# Patient Record
Sex: Male | Born: 1935 | Race: White | Hispanic: No | Marital: Married | Smoking: Never smoker
Health system: Southern US, Community
[De-identification: ages and names within clinical notes are randomized; demographics above are authoritative.]

## PROBLEM LIST (undated history)

## (undated) DIAGNOSIS — C760 Malignant neoplasm of head, face and neck: Secondary | ICD-10-CM

## (undated) DIAGNOSIS — R682 Dry mouth, unspecified: Secondary | ICD-10-CM

## (undated) DIAGNOSIS — R350 Frequency of micturition: Secondary | ICD-10-CM

## (undated) HISTORY — PX: ESOPHAGEAL DILATION: SHX303

## (undated) HISTORY — PX: KNEE SURGERY: SHX244

---

## 2003-06-26 DIAGNOSIS — C760 Malignant neoplasm of head, face and neck: Secondary | ICD-10-CM

## 2003-06-26 HISTORY — DX: Malignant neoplasm of head, face and neck: C76.0

## 2005-05-21 ENCOUNTER — Ambulatory Visit: Payer: Self-pay | Admitting: Oncology

## 2005-06-01 ENCOUNTER — Ambulatory Visit (HOSPITAL_COMMUNITY): Admission: RE | Admit: 2005-06-01 | Discharge: 2005-06-01 | Payer: Self-pay | Admitting: Oncology

## 2005-07-13 ENCOUNTER — Ambulatory Visit (HOSPITAL_COMMUNITY): Admission: RE | Admit: 2005-07-13 | Discharge: 2005-07-13 | Payer: Self-pay | Admitting: Oncology

## 2005-07-26 ENCOUNTER — Ambulatory Visit: Payer: Self-pay | Admitting: Oncology

## 2005-09-13 ENCOUNTER — Ambulatory Visit: Payer: Self-pay | Admitting: Pulmonary Disease

## 2005-09-13 LAB — CONVERTED CEMR LAB
Albumin: 3.9 g/dL
Alkaline Phosphatase: 54 units/L
BUN: 11 mg/dL
Calcium: 9.4 mg/dL
Creatinine, Ser: 0.8 mg/dL
Glucose, Bld: 110 mg/dL
MCV: 98.4 fL
RDW: 12.3 %
Total Bilirubin: 0.9 mg/dL
Total Protein: 6.1 g/dL
Triglyceride fasting, serum: 68 mg/dL

## 2005-10-24 ENCOUNTER — Ambulatory Visit: Payer: Self-pay | Admitting: Oncology

## 2005-10-26 LAB — COMPREHENSIVE METABOLIC PANEL
ALT: 13 U/L (ref 0–40)
Albumin: 4.5 g/dL (ref 3.5–5.2)
CO2: 32 mEq/L (ref 19–32)
Calcium: 9.5 mg/dL (ref 8.4–10.5)
Chloride: 100 mEq/L (ref 96–112)
Creatinine, Ser: 0.8 mg/dL (ref 0.4–1.5)
Potassium: 4.1 mEq/L (ref 3.5–5.3)

## 2005-10-26 LAB — CBC WITH DIFFERENTIAL/PLATELET
BASO%: 0.3 % (ref 0.0–2.0)
HCT: 38.1 % — ABNORMAL LOW (ref 38.7–49.9)
LYMPH%: 16.6 % (ref 14.0–48.0)
MCH: 33 pg (ref 28.0–33.4)
MCHC: 33.7 g/dL (ref 32.0–35.9)
MCV: 98.1 fL — ABNORMAL HIGH (ref 81.6–98.0)
MONO#: 0.8 10*3/uL (ref 0.1–0.9)
MONO%: 8.9 % (ref 0.0–13.0)
NEUT%: 72.5 % (ref 40.0–75.0)
Platelets: 292 10*3/uL (ref 145–400)
WBC: 9.1 10*3/uL (ref 4.0–10.0)

## 2006-03-19 ENCOUNTER — Ambulatory Visit: Payer: Self-pay | Admitting: Oncology

## 2006-03-21 ENCOUNTER — Ambulatory Visit (HOSPITAL_COMMUNITY): Admission: RE | Admit: 2006-03-21 | Discharge: 2006-03-21 | Payer: Self-pay | Admitting: Oncology

## 2006-03-21 LAB — COMPREHENSIVE METABOLIC PANEL
ALT: 15 U/L (ref 0–40)
AST: 17 U/L (ref 0–37)
Albumin: 4.4 g/dL (ref 3.5–5.2)
Alkaline Phosphatase: 48 U/L (ref 39–117)
Potassium: 4.4 mEq/L (ref 3.5–5.3)
Sodium: 139 mEq/L (ref 135–145)
Total Bilirubin: 0.6 mg/dL (ref 0.3–1.2)
Total Protein: 6.8 g/dL (ref 6.0–8.3)

## 2006-06-17 ENCOUNTER — Ambulatory Visit: Payer: Self-pay | Admitting: Oncology

## 2006-06-21 LAB — CBC WITH DIFFERENTIAL/PLATELET
Basophils Absolute: 0 10*3/uL (ref 0.0–0.1)
Eosinophils Absolute: 0.2 10*3/uL (ref 0.0–0.5)
HGB: 13.1 g/dL (ref 13.0–17.1)
LYMPH%: 22.8 % (ref 14.0–48.0)
MCV: 99.4 fL — ABNORMAL HIGH (ref 81.6–98.0)
MONO#: 0.7 10*3/uL (ref 0.1–0.9)
MONO%: 10.6 % (ref 0.0–13.0)
NEUT#: 4.2 10*3/uL (ref 1.5–6.5)
Platelets: 320 10*3/uL (ref 145–400)
RBC: 3.92 10*6/uL — ABNORMAL LOW (ref 4.20–5.71)
RDW: 13.2 % (ref 11.2–14.6)
WBC: 6.6 10*3/uL (ref 4.0–10.0)

## 2006-06-21 LAB — COMPREHENSIVE METABOLIC PANEL
Albumin: 4.6 g/dL (ref 3.5–5.2)
BUN: 18 mg/dL (ref 6–23)
CO2: 29 mEq/L (ref 19–32)
Glucose, Bld: 92 mg/dL (ref 70–99)
Potassium: 4.5 mEq/L (ref 3.5–5.3)
Sodium: 140 mEq/L (ref 135–145)
Total Protein: 7 g/dL (ref 6.0–8.3)

## 2006-06-21 LAB — LACTATE DEHYDROGENASE: LDH: 129 U/L (ref 94–250)

## 2006-09-17 ENCOUNTER — Ambulatory Visit: Payer: Self-pay | Admitting: Oncology

## 2006-09-20 LAB — CBC WITH DIFFERENTIAL/PLATELET
BASO%: 0.6 % (ref 0.0–2.0)
EOS%: 4.4 % (ref 0.0–7.0)
HCT: 36.8 % — ABNORMAL LOW (ref 38.7–49.9)
MCH: 33.2 pg (ref 28.0–33.4)
MCHC: 33.9 g/dL (ref 32.0–35.9)
MONO#: 0.6 10*3/uL (ref 0.1–0.9)
NEUT%: 58.8 % (ref 40.0–75.0)
RBC: 3.76 10*6/uL — ABNORMAL LOW (ref 4.20–5.71)
RDW: 13.3 % (ref 11.2–14.6)
WBC: 5 10*3/uL (ref 4.0–10.0)
lymph#: 1.2 10*3/uL (ref 0.9–3.3)

## 2006-09-20 LAB — COMPREHENSIVE METABOLIC PANEL
ALT: 13 U/L (ref 0–53)
AST: 16 U/L (ref 0–37)
Albumin: 4.3 g/dL (ref 3.5–5.2)
Alkaline Phosphatase: 45 U/L (ref 39–117)
BUN: 20 mg/dL (ref 6–23)
CO2: 30 mEq/L (ref 19–32)
Calcium: 9.2 mg/dL (ref 8.4–10.5)
Chloride: 104 mEq/L (ref 96–112)
Creatinine, Ser: 0.88 mg/dL (ref 0.40–1.50)
Glucose, Bld: 109 mg/dL — ABNORMAL HIGH (ref 70–99)
Potassium: 4.3 mEq/L (ref 3.5–5.3)
Sodium: 140 mEq/L (ref 135–145)
Total Bilirubin: 0.5 mg/dL (ref 0.3–1.2)
Total Protein: 6.5 g/dL (ref 6.0–8.3)

## 2006-09-20 LAB — LACTATE DEHYDROGENASE: LDH: 127 U/L (ref 94–250)

## 2007-01-15 ENCOUNTER — Ambulatory Visit: Payer: Self-pay | Admitting: Oncology

## 2007-01-17 ENCOUNTER — Ambulatory Visit (HOSPITAL_COMMUNITY): Admission: RE | Admit: 2007-01-17 | Discharge: 2007-01-17 | Payer: Self-pay | Admitting: Oncology

## 2007-01-17 LAB — CBC WITH DIFFERENTIAL/PLATELET
BASO%: 0.4 % (ref 0.0–2.0)
Basophils Absolute: 0 10*3/uL (ref 0.0–0.1)
EOS%: 6.6 % (ref 0.0–7.0)
MCH: 33.7 pg — ABNORMAL HIGH (ref 28.0–33.4)
MCHC: 34.2 g/dL (ref 32.0–35.9)
MCV: 98.4 fL — ABNORMAL HIGH (ref 81.6–98.0)
MONO%: 10.2 % (ref 0.0–13.0)
RDW: 13 % (ref 11.2–14.6)
lymph#: 1.5 10*3/uL (ref 0.9–3.3)

## 2007-01-17 LAB — COMPREHENSIVE METABOLIC PANEL
ALT: 10 U/L (ref 0–53)
AST: 16 U/L (ref 0–37)
Alkaline Phosphatase: 41 U/L (ref 39–117)
BUN: 20 mg/dL (ref 6–23)
Calcium: 9.5 mg/dL (ref 8.4–10.5)
Chloride: 105 mEq/L (ref 96–112)
Creatinine, Ser: 0.88 mg/dL (ref 0.40–1.50)
Potassium: 4.4 mEq/L (ref 3.5–5.3)

## 2007-05-12 ENCOUNTER — Ambulatory Visit: Payer: Self-pay | Admitting: Pulmonary Disease

## 2007-05-14 ENCOUNTER — Telehealth: Payer: Self-pay | Admitting: Pulmonary Disease

## 2007-10-15 ENCOUNTER — Ambulatory Visit: Payer: Self-pay | Admitting: Oncology

## 2007-10-17 ENCOUNTER — Ambulatory Visit (HOSPITAL_COMMUNITY): Admission: RE | Admit: 2007-10-17 | Discharge: 2007-10-17 | Payer: Self-pay | Admitting: Oncology

## 2007-10-17 ENCOUNTER — Encounter: Payer: Self-pay | Admitting: Pulmonary Disease

## 2007-10-17 LAB — LACTATE DEHYDROGENASE: LDH: 138 U/L (ref 94–250)

## 2007-10-17 LAB — CBC WITH DIFFERENTIAL/PLATELET
Basophils Absolute: 0.1 10*3/uL (ref 0.0–0.1)
EOS%: 5.4 % (ref 0.0–7.0)
Eosinophils Absolute: 0.3 10*3/uL (ref 0.0–0.5)
HCT: 38.3 % — ABNORMAL LOW (ref 38.7–49.9)
HGB: 13.3 g/dL (ref 13.0–17.1)
LYMPH%: 24.2 % (ref 14.0–48.0)
MCH: 34.6 pg — ABNORMAL HIGH (ref 28.0–33.4)
MCV: 99.6 fL — ABNORMAL HIGH (ref 81.6–98.0)
MONO%: 12.2 % (ref 0.0–13.0)
NEUT#: 3.2 10*3/uL (ref 1.5–6.5)
NEUT%: 57 % (ref 40.0–75.0)
Platelets: 282 10*3/uL (ref 145–400)

## 2007-10-17 LAB — COMPREHENSIVE METABOLIC PANEL
AST: 16 U/L (ref 0–37)
Albumin: 4.6 g/dL (ref 3.5–5.2)
Alkaline Phosphatase: 42 U/L (ref 39–117)
BUN: 20 mg/dL (ref 6–23)
Creatinine, Ser: 0.81 mg/dL (ref 0.40–1.50)
Glucose, Bld: 101 mg/dL — ABNORMAL HIGH (ref 70–99)

## 2008-10-12 ENCOUNTER — Ambulatory Visit: Payer: Self-pay | Admitting: Oncology

## 2008-10-14 ENCOUNTER — Encounter: Payer: Self-pay | Admitting: Pulmonary Disease

## 2008-10-14 ENCOUNTER — Ambulatory Visit (HOSPITAL_COMMUNITY): Admission: RE | Admit: 2008-10-14 | Discharge: 2008-10-14 | Payer: Self-pay | Admitting: Oncology

## 2008-10-14 LAB — CBC WITH DIFFERENTIAL/PLATELET
BASO%: 0.5 % (ref 0.0–2.0)
EOS%: 3.2 % (ref 0.0–7.0)
HCT: 37.9 % — ABNORMAL LOW (ref 38.4–49.9)
MCH: 33.7 pg — ABNORMAL HIGH (ref 27.2–33.4)
MCHC: 33.4 g/dL (ref 32.0–36.0)
MCV: 100.9 fL — ABNORMAL HIGH (ref 79.3–98.0)
MONO%: 10.6 % (ref 0.0–14.0)
NEUT%: 59.5 % (ref 39.0–75.0)
RDW: 13.6 % (ref 11.0–14.6)
lymph#: 1.4 10*3/uL (ref 0.9–3.3)

## 2008-10-14 LAB — COMPREHENSIVE METABOLIC PANEL
ALT: 10 U/L (ref 0–53)
AST: 15 U/L (ref 0–37)
Alkaline Phosphatase: 42 U/L (ref 39–117)
Calcium: 9.4 mg/dL (ref 8.4–10.5)
Chloride: 104 mEq/L (ref 96–112)
Creatinine, Ser: 0.96 mg/dL (ref 0.40–1.50)

## 2009-10-11 ENCOUNTER — Ambulatory Visit: Payer: Self-pay | Admitting: Oncology

## 2009-10-13 ENCOUNTER — Encounter: Payer: Self-pay | Admitting: Pulmonary Disease

## 2009-10-13 LAB — COMPREHENSIVE METABOLIC PANEL
Albumin: 4.4 g/dL (ref 3.5–5.2)
CO2: 29 mEq/L (ref 19–32)
Glucose, Bld: 123 mg/dL — ABNORMAL HIGH (ref 70–99)
Potassium: 4.5 mEq/L (ref 3.5–5.3)
Sodium: 141 mEq/L (ref 135–145)
Total Bilirubin: 0.4 mg/dL (ref 0.3–1.2)
Total Protein: 6.4 g/dL (ref 6.0–8.3)

## 2009-10-13 LAB — LACTATE DEHYDROGENASE: LDH: 157 U/L (ref 94–250)

## 2009-10-13 LAB — CBC WITH DIFFERENTIAL/PLATELET
Eosinophils Absolute: 0.1 10*3/uL (ref 0.0–0.5)
HCT: 38.1 % — ABNORMAL LOW (ref 38.4–49.9)
LYMPH%: 21.6 % (ref 14.0–49.0)
MONO#: 0.6 10*3/uL (ref 0.1–0.9)
NEUT#: 3.8 10*3/uL (ref 1.5–6.5)
Platelets: 293 10*3/uL (ref 140–400)
RBC: 3.74 10*6/uL — ABNORMAL LOW (ref 4.20–5.82)
WBC: 5.7 10*3/uL (ref 4.0–10.3)

## 2009-10-19 ENCOUNTER — Ambulatory Visit (HOSPITAL_COMMUNITY): Admission: RE | Admit: 2009-10-19 | Discharge: 2009-10-19 | Payer: Self-pay | Admitting: Oncology

## 2010-04-19 ENCOUNTER — Telehealth: Payer: Self-pay | Admitting: Pulmonary Disease

## 2010-05-03 ENCOUNTER — Ambulatory Visit: Payer: Self-pay | Admitting: Internal Medicine

## 2010-05-03 DIAGNOSIS — S93336A Other dislocation of unspecified foot, initial encounter: Secondary | ICD-10-CM

## 2010-05-03 DIAGNOSIS — M79609 Pain in unspecified limb: Secondary | ICD-10-CM

## 2010-05-03 DIAGNOSIS — Z96659 Presence of unspecified artificial knee joint: Secondary | ICD-10-CM

## 2010-05-03 DIAGNOSIS — Z8589 Personal history of malignant neoplasm of other organs and systems: Secondary | ICD-10-CM | POA: Insufficient documentation

## 2010-05-15 ENCOUNTER — Encounter: Payer: Self-pay | Admitting: Internal Medicine

## 2010-06-20 ENCOUNTER — Encounter: Payer: Self-pay | Admitting: Pulmonary Disease

## 2010-07-16 ENCOUNTER — Encounter (HOSPITAL_COMMUNITY): Payer: Self-pay | Admitting: Oncology

## 2010-07-25 NOTE — Assessment & Plan Note (Signed)
Summary: NEW / MEDICARE/[place]   Vital Signs:  Patient profile:   77 year old male Height:      68.5 inches (173.99 cm) Weight:      148.4 pounds (67.45 kg) BMI:     22.32 O2 Sat:      97 % on Room air Temp:     98.1 degrees F (36.72 degrees C) oral Pulse rate:   83 / minute BP sitting:   142 / 82  (left arm) Cuff size:   regular  Vitals Entered By: Orlan Leavens RMA (May 03, 2010 3:00 PM)  O2 Flow:  Room air CC: New patient Is Patient Diabetic? No Pain Assessment Patient in pain? no        Primary Care Provider:  Newt Lukes MD  CC:  New patient.  History of Present Illness: new pt to me and our division, here to est care - remotely followed by nadel  c/o "toe lump" at base of 2nd right toe onset 03/23/10 - unchanged in size since 1st noted denies any precipitating trauma - no assoc pain, no inc swelling, no redness feels "uncomfortable" in shoe but nonpainful no hx same- no prior gout or arthritis no current joint pain or problems since onset of this toe issue does not affect walking ability - active walking new dog daily  -  Date:  09/13/2005    WBC: 9.0    HGB: 12.4    HCT: 37.9    RBC: 3.85    PLT: 315    MCV: 98.4    RDW: 12.3    BG Random: 110    BUN: 11    Creatinine: 0.8    Sodium: 143    Potassium: 4.3    Chloride: 105    CO2 Total: 30    SGOT (AST): 20    SGPT (ALT): 15    T. Bilirubin: 0.9    Alk Phos: 54    Calcium: 9.4    Total Protein: 6.1    Albumin: 3.9    Cholesterol: 187    LDL: 122    HDL: 61.7    Triglycerides: 68    TSH: 1.20    PSA 3.32  Current Medications (verified): 1)  Multivitamins  Tabs (Multiple Vitamin) .... Take 1 By Mouth Once Daily 2)  Salagen 5 Mg Tabs (Pilocarpine Hcl) .... Take 1 By Mouth Once Daily  Allergies (verified): 1)  ! 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    Physical Exam  General:  alert, well-developed, well-nourished, and cooperative to examination.    Lungs:  normal respiratory effort, no intercostal retractions or use of accessory muscles; normal breath sounds bilaterally - no crackles and no wheezes.    Heart:  normal rate, regular rhythm, no murmur, and no rub. BLE without edema. Msk:  right 2nd toe deformity with lumpy prominence over dorsal side at base of toe - 2nd toe notably shorter than other toes and pointed plantar when other toes have normal alignment Additional Exam:  procedure: closed reduction PIP 2nd toe, right foot indication: deformity,  dislocation and pain/discomfort after informed consent, closed reduction of PIP joint was performed without incident or complication. improved alignment noted. patient tolerated procedure well. 2nd toe buddy taped to 3rd toe with gauze between toes to maintain reduction.   Impression & Recommendations:  Problem # 1:  PAIN IN SOFT TISSUES OF LIMB (ICD-729.5) clinically appears c/w dislocation - xray today confirms same (read by me) unfortunately, occurred > 4weeks ago -   s/p closed reduction in office today keep buddy taped for at least one month-  same demonstrated to pt to do same at home if PIP continues to sublux or dislocate, refer to ortho for tx of same to prevent chronic dislocation complications such as pressure sores Orders: T-Toe(s) (73660TC) EMR Misc Charge Code (EMRMisc)  Problem # 2:  CLOSED DISLOCATION OF OTHER PART OF FOOT (ICD-838.09) see discussion above Orders: Orthopedic Surgeon Referral (Ortho Surgeon) EMR Misc Charge Code [facility])  Complete  Medication List: 1)  Multivitamins Tabs (Multiple vitamin) .... Take 1 by mouth once daily 2)  Salagen 5 Mg Tabs (Pilocarpine hcl) .... Take 1 by mouth once daily  Patient Instructions: 1)  it was good to see you today. 2)  your toe lump is due to a dislocation - we have reduced it but will also have foot specialist dr. Victorino Dike look at it for more help to keep it in place (referral made today) - Our office will contact you regarding this appointment once made.  3)  keep "buddy taped" until that time as demonstrated today , do this daily for next 30 days or as directed by dr. Victorino Dike 4)  Please schedule a follow-up appointment as needed.   Orders Added: 1)  New Patient Level III [16109] 2)  T-Toe(s) [73660TC] 3)  Orthopedic Surgeon Referral [Ortho Surgeon] 4)  EMR Misc Charge Code [EMRMisc]

## 2010-07-25 NOTE — Progress Notes (Signed)
Summary: SWOLLEN JOINT  Phone Note Call from Patient   Caller: Patient Call For: NADEL Summary of Call: PT HAVE SWOLLEN JOINT IN HIS TOE. NOT PAINFUL Initial call taken by: Rickard Patience,  April 19, 2010 11:00 AM  Follow-up for Phone Call        called spoke with patient's wife.  she states that pt's third toe on the right foot is swollen, with some redness x3weeks - denies pain.  wife is aware that an appt may be needed.  NOTE: pt has not been since in the office since 2008.  the paper chart has been ordered. Follow-up by: Boone Master CNA/MA,  April 19, 2010 12:28 PM  Additional Follow-up for Phone Call Additional follow up Details #1::        called to get an appt with primary at [facility]---scheduled an appt at 8am in the am but pt is unable to keep this appt---he stated that he is going to call them to see if he can reschedule to the afternoon-- Randell Loop [facility]  April 19, 2010 4:54 PM

## 2010-07-25 NOTE — Letter (Signed)
[facility]   Im[facility] By: Sherian Rein 11/07/2009 10:43:54  _____________________________________________________________________  External Attachment:    Type:   Image     Comment:   External Document

## 2010-07-25 NOTE — Consult Note (Signed)
[facility]   Imported By: Sherian Rein 05/15/2010 10:09:54  _____________________________________________________________________  External Attachment:    Type:   Image     Comment:   External Document

## 2010-07-27 NOTE — Letter (Signed)
Summary: [facility]   Imported By: Sherian Rein 07/07/2010 14:06:17  _____________________________________________________________________  External Attachment:    Type:   Image     Comment:   External Document

## 2010-10-26 ENCOUNTER — Other Ambulatory Visit (HOSPITAL_COMMUNITY): Payer: Self-pay | Admitting: Oncology

## 2010-10-26 ENCOUNTER — Encounter (HOSPITAL_BASED_OUTPATIENT_CLINIC_OR_DEPARTMENT_OTHER): Payer: Medicare Other | Admitting: Oncology

## 2010-10-26 DIAGNOSIS — Z09 Encounter for follow-up examination after completed treatment for conditions other than malignant neoplasm: Secondary | ICD-10-CM

## 2010-10-26 DIAGNOSIS — Z8589 Personal history of malignant neoplasm of other organs and systems: Secondary | ICD-10-CM

## 2010-10-26 LAB — CBC WITH DIFFERENTIAL/PLATELET
BASO%: 0.3 % (ref 0.0–2.0)
MCHC: 33 g/dL (ref 32.0–36.0)
MONO#: 0.9 10*3/uL (ref 0.1–0.9)
RBC: 3.69 10*6/uL — ABNORMAL LOW (ref 4.20–5.82)
RDW: 13.7 % (ref 11.0–14.6)
WBC: 8.4 10*3/uL (ref 4.0–10.3)
lymph#: 1.8 10*3/uL (ref 0.9–3.3)

## 2010-10-26 LAB — COMPREHENSIVE METABOLIC PANEL
ALT: 13 U/L (ref 0–53)
CO2: 27 mEq/L (ref 19–32)
Calcium: 8.9 mg/dL (ref 8.4–10.5)
Chloride: 102 mEq/L (ref 96–112)
Sodium: 139 mEq/L (ref 135–145)
Total Protein: 6.6 g/dL (ref 6.0–8.3)

## 2010-10-26 LAB — LACTATE DEHYDROGENASE: LDH: 144 U/L (ref 94–250)

## 2011-07-28 ENCOUNTER — Telehealth: Payer: Self-pay | Admitting: Oncology

## 2011-07-28 NOTE — Telephone Encounter (Signed)
lmonvm adviisng the pt of his may 2013 appt

## 2011-08-24 ENCOUNTER — Other Ambulatory Visit: Payer: Self-pay | Admitting: *Deleted

## 2011-08-24 MED ORDER — CYCLOBENZAPRINE HCL 10 MG PO TABS: 10.0000 mg | ORAL_TABLET | Freq: Four times a day (QID) | ORAL | Status: AC | PRN

## 2011-08-29 ENCOUNTER — Other Ambulatory Visit: Payer: Self-pay | Admitting: *Deleted

## 2011-10-19 ENCOUNTER — Telehealth: Payer: Self-pay | Admitting: Oncology

## 2011-10-19 NOTE — Telephone Encounter (Signed)
pt called to cx 5/2 appt and will c/b to r/s  aom

## 2011-10-25 ENCOUNTER — Ambulatory Visit: Payer: Medicare Other | Admitting: Oncology

## 2011-10-25 ENCOUNTER — Ambulatory Visit: Payer: Medicare Other | Admitting: Physician Assistant

## 2011-10-25 ENCOUNTER — Other Ambulatory Visit: Payer: Medicare Other | Admitting: Lab

## 2012-05-22 ENCOUNTER — Other Ambulatory Visit: Payer: Self-pay | Admitting: Oncology

## 2012-05-23 NOTE — Telephone Encounter (Signed)
Patient has not been seen since 10/2010, needs appt

## 2012-05-29 ENCOUNTER — Telehealth: Payer: Self-pay | Admitting: *Deleted

## 2012-05-29 ENCOUNTER — Other Ambulatory Visit: Payer: Self-pay | Admitting: Oncology

## 2012-05-29 NOTE — Telephone Encounter (Signed)
Refill request received via surescript for ketoconazole 2% shampoo.  Patient needs an appointment is message sent for this refill request..

## 2016-10-31 ENCOUNTER — Encounter (HOSPITAL_BASED_OUTPATIENT_CLINIC_OR_DEPARTMENT_OTHER): Payer: Self-pay

## 2016-10-31 ENCOUNTER — Emergency Department (HOSPITAL_BASED_OUTPATIENT_CLINIC_OR_DEPARTMENT_OTHER)
Admission: EM | Admit: 2016-10-31 | Discharge: 2016-10-31 | Disposition: A | Discharge status: Home / Self Care | Payer: Medicare Other | Attending: Emergency Medicine | Admitting: Emergency Medicine

## 2016-10-31 DIAGNOSIS — M96830 Postprocedural hemorrhage and hematoma of a musculoskeletal structure following a musculoskeletal system procedure: Secondary | ICD-10-CM | POA: Diagnosis present

## 2016-10-31 DIAGNOSIS — S61412A Laceration without foreign body of left hand, initial encounter: Secondary | ICD-10-CM | POA: Diagnosis not present

## 2016-10-31 DIAGNOSIS — Z79899 Other long term (current) drug therapy: Secondary | ICD-10-CM | POA: Insufficient documentation

## 2016-10-31 DIAGNOSIS — Y999 Unspecified external cause status: Secondary | ICD-10-CM | POA: Diagnosis not present

## 2016-10-31 DIAGNOSIS — Y939 Activity, unspecified: Secondary | ICD-10-CM | POA: Diagnosis not present

## 2016-10-31 DIAGNOSIS — R58 Hemorrhage, not elsewhere classified: Secondary | ICD-10-CM

## 2016-10-31 DIAGNOSIS — Y929 Unspecified place or not applicable: Secondary | ICD-10-CM | POA: Insufficient documentation

## 2016-10-31 DIAGNOSIS — X58XXXA Exposure to other specified factors, initial encounter: Secondary | ICD-10-CM | POA: Diagnosis not present

## 2016-10-31 HISTORY — DX: Dry mouth, unspecified: R68.2

## 2016-10-31 HISTORY — DX: Frequency of micturition: R35.0

## 2016-10-31 MED ORDER — LIDOCAINE-EPINEPHRINE 1 %-1:100000 IJ SOLN
INTRAMUSCULAR | Status: AC
  Administered 2016-10-31: 1 mL
  Filled 2016-10-31: qty 1

## 2016-10-31 NOTE — ED Triage Notes (Signed)
Pt states he had skin biopsy removed at dermatology yesterday-removed dsg this am-bleeding started-dsg applied at health clinic at  [facility] and kerlix wrap in place with no bleed through-NAD-steady gait

## 2016-10-31 NOTE — ED Provider Notes (Signed)
[facility]PT [facility] Provider Note   CSN: [id number] Arrival date & time: 10/31/16  1340     History   Chief Complaint Chief Complaint  Patient presents with  . Hand Problem    HPI Kevin Bender is a 83 y.o. male.  83 yo M with a chief complaint of bleeding from biopsy site. Patient had a skin biopsy done at his dermatologist office yesterday. He change the bandage today and noticed some very brisk bleeding. He went to his family doctor's office who placed a dressing over it and suggested he come to the ED. Denies lightheadedness. Initially describes some spurting of blood. Denied significant tenderness.   The history is provided by the patient.  Illness  This is a new problem. The current episode started yesterday. The problem occurs constantly. The problem has not changed since onset.Pertinent negatives include no chest pain, no abdominal pain, no headaches and no shortness of breath. Nothing aggravates the symptoms. Nothing relieves the symptoms. He has tried nothing for the symptoms. The treatment provided no relief.    Past Medical History:  Diagnosis Date  . Dry mouth   . Urinary frequency     Patient Active Problem List   Diagnosis Date Noted  . CARCINOMA, SQUAMOUS CELL 05/03/2010  . PAIN IN SOFT TISSUES OF LIMB 05/03/2010  . CLOSED DISLOCATION OF OTHER PART OF FOOT 05/03/2010  . KNEE REPLACEMENT, LEFT, HX OF 05/03/2010    Past Surgical History:  Procedure Laterality Date  . ESOPHAGEAL DILATION    . KNEE SURGERY         Home Medications    Prior to Admission medications   Medication Sig Start Date End Date Taking? Authorizing Provider  Fesoterodine Fumarate (TOVIAZ PO) Take by mouth.   Yes [provider]  HYDRALAZINE-HCTZ PO Take by mouth.   Yes [provider]  TRAMADOL HCL ER PO Take by mouth.   Yes [provider]    Family History No family history on file.  Social History Social History  Substance Use Topics    . Smoking status: Never Smoker  . Smokeless tobacco: Never Used  . Alcohol use No     Allergies   Ibuprofen   Review of Systems Review of Systems  Constitutional: Negative for chills and fever.  HENT: Negative for congestion and facial swelling.   Eyes: Negative for discharge and visual disturbance.  Respiratory: Negative for shortness of breath.   Cardiovascular: Negative for chest pain and palpitations.  Gastrointestinal: Negative for abdominal pain, diarrhea and vomiting.  Musculoskeletal: Negative for arthralgias and myalgias.  Skin: Positive for wound. Negative for color change and rash.  Neurological: Negative for tremors, syncope and headaches.  Psychiatric/Behavioral: Negative for confusion and dysphoric mood.     Physical Exam Updated Vital Signs BP (!) 156/100 (BP Location: Right Arm)   Pulse 84   Temp 98.3 F (36.8 C) (Oral)   Resp 16   Ht 5\' 7"  (1.702 m)   Wt 140 lb (63.5 kg)   SpO2 100%   BMI 21.93 kg/m   Physical Exam  Constitutional: He is oriented to person, place, and time. He appears well-developed and well-nourished.  HENT:  Head: Normocephalic and atraumatic.  Eyes: EOM are normal. Pupils are equal, round, and reactive to light.  Neck: Normal range of motion. Neck supple. No JVD present.  Cardiovascular: Normal rate and regular rhythm.  Exam reveals no gallop and no friction rub.   No murmur heard. Pulmonary/Chest: No respiratory distress. He  has no wheezes.  Abdominal: He exhibits no distension. There is no rebound and no guarding.  Musculoskeletal: Normal range of motion.  1cm avulsion from biopsy to left dorsum of hand.    Neurological: He is alert and oriented to person, place, and time.  Skin: No rash noted. No pallor.  Psychiatric: He has a normal mood and affect. His behavior is normal.  Nursing note and vitals reviewed.    ED Treatments / Results  Labs (all labs ordered are listed, but only abnormal results are displayed) Labs  Reviewed - No data to display  EKG  EKG Interpretation None       Radiology No results found.  Procedures .Marland KitchenLaceration Repair Date/Time: 10/31/2016 3:19 PM Performed by: Tyrone Nine Gregor Dershem Authorized by: Deno Etienne   Consent:    Consent obtained:  Verbal   Consent given by:  Patient   Risks discussed:  Infection and pain   Alternatives discussed:  No treatment Anesthesia (see MAR for exact dosages):    Anesthesia method:  Local infiltration   Local anesthetic:  Lidocaine 1% WITH epi Laceration details:    Location:  Hand   Hand location:  L hand, dorsum Repair type:    Repair type:  Simple Comments:     Patient was having significant oozing of blood from the biopsy site to his left hand. Direct pressure and epinephrine with significant improvement that do not stop bleeding. A figure-of-eight stitch was placed using 5-0 Ethilon.   (including critical care time)  Medications Ordered in ED Medications  lidocaine-EPINEPHrine (XYLOCAINE W/EPI) 1 %-1:100000 (with pres) injection (1 mL  Given 10/31/16 1426)     Initial Impression / Assessment and Plan / ED Course  I have reviewed the triage vital signs and the nursing notes.  Pertinent labs & imaging results that were available during my care of the patient were reviewed by me and considered in my medical decision making (see chart for details).     83 yo M With bleeding from biopsy site. Controlled with direct pressure epinephrine and then 8 figure-of-eight stitch.  3:22 PM:  I have discussed the diagnosis/risks/treatment options with the patient and believe the pt to be eligible for discharge home to follow-up with PCP/Derm/here. We also discussed returning to the ED immediately if new or worsening sx occur. We discussed the sx which are most concerning (e.g., sudden worsening pain, fever, inability to tolerate by mouth) that necessitate immediate return. Medications administered to the patient during their visit and any new  prescriptions provided to the patient are listed below.  Medications given during this visit Medications  lidocaine-EPINEPHrine (XYLOCAINE W/EPI) 1 %-1:100000 (with pres) injection (1 mL  Given 10/31/16 1426)     The patient appears reasonably screen and/or stabilized for discharge and I doubt any other medical condition or other [facility] requiring further screening, evaluation, or treatment in the ED at this time prior to discharge.    Final Clinical Impressions(s) / ED Diagnoses   Final diagnoses:  Bleeding    New Prescriptions Discharge Medication List as of 10/31/2016  2:59 PM       Deno Etienne, DO 10/31/16 1522

## 2016-10-31 NOTE — ED Notes (Signed)
ED Provider at bedside. 

## 2016-10-31 NOTE — Discharge Instructions (Signed)
Keep clean and moist.  Follow up here, with your PCP or dermatologist for suture removal in 2 days.

## 2018-05-12 ENCOUNTER — Encounter (HOSPITAL_COMMUNITY): Payer: Self-pay | Admitting: Internal Medicine

## 2018-05-12 ENCOUNTER — Emergency Department (HOSPITAL_COMMUNITY): Payer: Medicare Other

## 2018-05-12 ENCOUNTER — Emergency Department (HOSPITAL_COMMUNITY)
Admission: EM | Admit: 2018-05-12 | Discharge: 2018-05-25 | Disposition: E | Payer: Medicare Other | Attending: Emergency Medicine | Admitting: Emergency Medicine

## 2018-05-12 DIAGNOSIS — Z85828 Personal history of other malignant neoplasm of skin: Secondary | ICD-10-CM | POA: Insufficient documentation

## 2018-05-12 DIAGNOSIS — Z96652 Presence of left artificial knee joint: Secondary | ICD-10-CM | POA: Diagnosis not present

## 2018-05-12 DIAGNOSIS — I469 Cardiac arrest, cause unspecified: Secondary | ICD-10-CM | POA: Diagnosis not present

## 2018-05-12 DIAGNOSIS — Z79899 Other long term (current) drug therapy: Secondary | ICD-10-CM | POA: Diagnosis not present

## 2018-05-12 DIAGNOSIS — D649 Anemia, unspecified: Secondary | ICD-10-CM | POA: Diagnosis present

## 2018-05-12 DIAGNOSIS — R571 Hypovolemic shock: Secondary | ICD-10-CM | POA: Diagnosis not present

## 2018-05-12 DIAGNOSIS — W19XXXA Unspecified fall, initial encounter: Secondary | ICD-10-CM | POA: Insufficient documentation

## 2018-05-12 DIAGNOSIS — E872 Acidosis, unspecified: Secondary | ICD-10-CM | POA: Diagnosis present

## 2018-05-12 DIAGNOSIS — R4182 Altered mental status, unspecified: Secondary | ICD-10-CM | POA: Diagnosis present

## 2018-05-12 DIAGNOSIS — Z8589 Personal history of malignant neoplasm of other organs and systems: Secondary | ICD-10-CM

## 2018-05-12 HISTORY — DX: Malignant neoplasm of head, face and neck: C76.0

## 2018-05-12 LAB — I-STAT CG4 LACTIC ACID, ED
LACTIC ACID, VENOUS: 8.34 mmol/L — AB (ref 0.5–1.9)
Lactic Acid, Venous: 7.03 mmol/L (ref 0.5–1.9)

## 2018-05-12 LAB — CBC WITH DIFFERENTIAL/PLATELET
BASOS PCT: 0 %
BLASTS: 0 %
Band Neutrophils: 0 %
EOS PCT: 4 %
HEMATOCRIT: 21.9 % — AB (ref 39.0–52.0)
Hemoglobin: 6.6 g/dL — CL (ref 13.0–17.0)
Lymphocytes Relative: 32 %
MCH: 29.6 pg (ref 26.0–34.0)
MCHC: 30.1 g/dL (ref 30.0–36.0)
MCV: 98.2 fL (ref 80.0–100.0)
MONOS PCT: 8 %
MYELOCYTES: 0 %
Metamyelocytes Relative: 0 %
NEUTROS PCT: 56 %
NRBC: 0 % (ref 0.0–0.2)
OTHER: 0 %
Platelets: 360 10*3/uL (ref 150–400)
Promyelocytes Relative: 0 %
RBC: 2.23 MIL/uL — ABNORMAL LOW (ref 4.22–5.81)
RDW: 14.2 % (ref 11.5–15.5)
WBC: 21.2 10*3/uL — ABNORMAL HIGH (ref 4.0–10.5)
nRBC: 0 /100 WBC

## 2018-05-12 LAB — COMPREHENSIVE METABOLIC PANEL
ALBUMIN: 2.2 g/dL — AB (ref 3.5–5.0)
ALK PHOS: 120 U/L (ref 38–126)
ALT: 33 U/L (ref 0–44)
ANION GAP: 9 (ref 5–15)
AST: 33 U/L (ref 15–41)
BILIRUBIN TOTAL: 0.7 mg/dL (ref 0.3–1.2)
BUN: 19 mg/dL (ref 8–23)
CALCIUM: 7.6 mg/dL — AB (ref 8.9–10.3)
CHLORIDE: 106 mmol/L (ref 98–111)
CO2: 22 mmol/L (ref 22–32)
Creatinine, Ser: 1.2 mg/dL (ref 0.61–1.24)
GFR calc non Af Amer: 54 mL/min — ABNORMAL LOW (ref >60)
GLUCOSE: 205 mg/dL — AB (ref 70–99)
POTASSIUM: 5 mmol/L (ref 3.5–5.1)
Sodium: 137 mmol/L (ref 135–145)
Total Protein: 5.1 g/dL — ABNORMAL LOW (ref 6.5–8.1)

## 2018-05-12 LAB — PREPARE RBC (CROSSMATCH)

## 2018-05-12 LAB — ABO/RH: ABO/RH(D): O POS

## 2018-05-12 LAB — POC OCCULT BLOOD, ED: Fecal Occult Bld: NEGATIVE

## 2018-05-12 MED ORDER — SODIUM CHLORIDE 0.9 % IV SOLN: 10.0000 mL/h | Freq: Once | INTRAVENOUS | Status: DC

## 2018-05-12 MED ORDER — SODIUM CHLORIDE 0.9 % IV BOLUS (SEPSIS)
1000.0000 mL | Freq: Once | INTRAVENOUS | Status: AC
  Administered 2018-05-12: 1000 mL via INTRAVENOUS

## 2018-05-12 MED ORDER — VANCOMYCIN HCL IN DEXTROSE 1-5 GM/200ML-% IV SOLN
1000.0000 mg | Freq: Once | INTRAVENOUS | Status: AC
  Administered 2018-05-12: 1000 mg via INTRAVENOUS
  Filled 2018-05-12: qty 200

## 2018-05-12 MED ORDER — SODIUM CHLORIDE 0.9 % IV SOLN
2.0000 g | Freq: Once | INTRAVENOUS | Status: AC
  Administered 2018-05-12: 2 g via INTRAVENOUS
  Filled 2018-05-12: qty 2

## 2018-05-12 MED ORDER — IOPAMIDOL (ISOVUE-370) INJECTION 76%
INTRAVENOUS | Status: AC
  Filled 2018-05-12: qty 100

## 2018-05-12 MED ORDER — METRONIDAZOLE IN NACL 5-0.79 MG/ML-% IV SOLN
500.0000 mg | Freq: Three times a day (TID) | INTRAVENOUS | Status: DC
  Administered 2018-05-12: 500 mg via INTRAVENOUS
  Filled 2018-05-12: qty 100

## 2018-05-12 MED ORDER — SODIUM CHLORIDE 0.9 % IV SOLN: 1.0000 g | INTRAVENOUS | Status: DC

## 2018-05-12 MED ORDER — VANCOMYCIN HCL IN DEXTROSE 1-5 GM/200ML-% IV SOLN: 1000.0000 mg | INTRAVENOUS | Status: DC

## 2018-05-13 LAB — BPAM RBC
BLOOD PRODUCT EXPIRATION DATE: 201912152359
BLOOD PRODUCT EXPIRATION DATE: 201912162359
ISSUE DATE / TIME: 201911152237
ISSUE DATE / TIME: 201911152256
UNIT TYPE AND RH: 5100
Unit Type and Rh: 5100

## 2018-05-13 LAB — BLOOD CULTURE ID PANEL (REFLEXED)
ACINETOBACTER BAUMANNII: NOT DETECTED
CANDIDA KRUSEI: NOT DETECTED
Candida albicans: NOT DETECTED
Candida glabrata: NOT DETECTED
Candida parapsilosis: NOT DETECTED
Candida tropicalis: NOT DETECTED
ENTEROCOCCUS SPECIES: NOT DETECTED
ESCHERICHIA COLI: NOT DETECTED
Enterobacter cloacae complex: NOT DETECTED
Enterobacteriaceae species: NOT DETECTED
HAEMOPHILUS INFLUENZAE: NOT DETECTED
Klebsiella oxytoca: NOT DETECTED
Klebsiella pneumoniae: NOT DETECTED
LISTERIA MONOCYTOGENES: NOT DETECTED
METHICILLIN RESISTANCE: NOT DETECTED
NEISSERIA MENINGITIDIS: NOT DETECTED
PROTEUS SPECIES: NOT DETECTED
PSEUDOMONAS AERUGINOSA: NOT DETECTED
SERRATIA MARCESCENS: NOT DETECTED
STAPHYLOCOCCUS AUREUS BCID: NOT DETECTED
STAPHYLOCOCCUS SPECIES: DETECTED — AB
STREPTOCOCCUS AGALACTIAE: NOT DETECTED
STREPTOCOCCUS PNEUMONIAE: NOT DETECTED
Streptococcus pyogenes: NOT DETECTED
Streptococcus species: NOT DETECTED

## 2018-05-13 LAB — TYPE AND SCREEN
ABO/RH(D): O POS
ANTIBODY SCREEN: NEGATIVE
UNIT DIVISION: 0
Unit division: 0

## 2018-05-15 LAB — CULTURE, BLOOD (ROUTINE X 2): SPECIAL REQUESTS: ADEQUATE

## 2018-05-16 ENCOUNTER — Telehealth: Payer: Self-pay | Admitting: Emergency Medicine

## 2018-05-17 LAB — CULTURE, BLOOD (ROUTINE X 2)
Culture: NO GROWTH
Special Requests: ADEQUATE

## 2018-05-25 NOTE — ED Notes (Signed)
Cleaned stool from the pt mediujm brown x 2 condom cath placed

## 2018-05-25 NOTE — ED Notes (Signed)
Pt went apneic and asystole at 0744 with wife at bedside, Dr Lorin Mercy and this RN. Time of death 49.

## 2018-05-25 NOTE — ED Notes (Signed)
No urine from condom catheter  3 attempts to cath the pt failed bladder scanner showed a bladder full of urine.. The pt has pulled off his new condom catheter.  Very restless pulling at his clothes and all his tubes,.  His wife never showed up and she cannot be found no answer on her phone

## 2018-05-25 NOTE — ED Notes (Signed)
Pt's wife and other family member now at bedside visiting with pt. Dr Betsey Holiday in room to explain everything that has been done and plan of care. Wife wants no further interventions, pt to be kept comfortable. DNR at bedside. Pt's breathing is labored, HR is slowly dropping.

## 2018-05-25 NOTE — ED Notes (Signed)
Dr Betsey Holiday informed of lactic acid results 8.34

## 2018-05-25 NOTE — ED Notes (Signed)
To c-t and back agitated in c-t

## 2018-05-25 NOTE — ED Notes (Signed)
Pupils 3.0 equal and react sluggishly

## 2018-05-25 NOTE — Progress Notes (Signed)
Pharmacy Antibiotic Note  Kevin Bender is a 84 y.o. male admitted on 05-16-2018 with sepsis.  Pharmacy has been consulted for vancomycin and cefepime dosing. Initial doses ordered in the ED  Plan: Continue cefepime 1gm IV q24 hours Continue vancomycin 1gm IV q24 hours F/u renal function, cultures and clinical course  Height: 5\' 11"  (180.3 cm) Weight: 139 lb 15.9 oz (63.5 kg) IBW/kg (Calculated) : 75.3  No data recorded.  Recent Labs  Lab 05/16/18 0322 May 16, 2018 0328  CREATININE 1.20  --   LATICACIDVEN  --  7.03*    Estimated Creatinine Clearance: 41.2 mL/min (by C-G formula based on SCr of 1.2 mg/dL).    Allergies  Allergen Reactions  . Ibuprofen     Thank you for allowing pharmacy to be a part of this patient's care.  Excell Seltzer Poteet 05/16/18 4:22 AM

## 2018-05-25 NOTE — ED Triage Notes (Signed)
The pt arrived by [facility] from home called by the pts wife who does  Not drive he fell earlier and could not get up   Incontinent of stool  bp in the [facility] when ems arrived  1 liter  Of fluid nss on the way here

## 2018-05-25 NOTE — Death Summary Note (Signed)
Death Summary   Kevin Bender UEA:[id number] DOB: 20-Dec-1931 DOA: 05/31/18  PCP: Javier Glazier, MD  Patient coming from:  Home - lives with wife at [place]    HPI: Kevin Bender is a 84 y.o. 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  The patient's wife reports that he has had a period of progressive illness for the last few weeks with night sweats and progressive illness.  He has been increasingly worsening and refused to go to the doctor, but he finally agreed that he would go to the clinic today.  Overnight, he fell in the bathroom, had diarrhea, and was hypotensive upon EMS arrival but alert.  He had progressive worsening of his mental status and was profoundly ill upon arrival in the ER.      ED Course: 84 yo M actively dying, EDP doesn't know what he is dying of. Patient is a DNR. EDP cant get a-hold of wife apparently. Presumed sepsis on arrival, unwitnessed fall at home, hypotensive and tachycardic. Got IVF in ED BP improved but then dropped again. HGB is 6. EDP has ordered blood. BP 90 systolic. Lactate 7 initially, repeat after fluid is 8!  Review of Systems: Unable to perform  Past Medical History:  Diagnosis Date  . Dry mouth   . Head and neck cancer ([place]) 2005  . Urinary frequency     Past Surgical History:  Procedure Laterality Date  . ESOPHAGEAL DILATION    . KNEE SURGERY      Social History   Socioeconomic History  . Marital status: Married    Spouse name: Not on file  . Number of children: Not on file  . Years of education: Not on file  . Highest education level: Not on file  Occupational History  . Not on file  Social Needs  . Financial resource strain: Not on file  . Food insecurity:    Worry: Not on file    Inability: Not on file  . Transportation needs:    Medical: Not on file    Non-medical: Not on file  Tobacco Use  . Smoking status: Never Smoker  .  Smokeless tobacco: Never Used  Substance and Sexual Activity  . Alcohol use: No  . Drug use: No  . Sexual activity: Not on file  Lifestyle  . Physical activity:    Days per week: Not on file    Minutes per session: Not on file  . Stress: Not on file  Relationships  . Social connections:    Talks on phone: Not on file    Gets together: Not on file    Attends religious service: Not on file    Active member of club or organization: Not on file    Attends meetings of clubs or organizations: Not on file    Relationship status: Not on file  . Intimate partner violence:    Fear of current or ex partner: Not on file    Emotionally abused: Not on file    Physically abused: Not on file    Forced sexual activity: Not on file  Other Topics Concern  . Not on file  Social History Narrative  . Not on file    Allergies  Allergen Reactions  . Ibuprofen     No family history on file.  Prior to Admission medications   Medication Sig Start Date End Date Taking? Authorizing Provider  Fesoterodine Fumarate (TOVIAZ  PO) Take by mouth.    [provider]  HYDRALAZINE-HCTZ PO Take by mouth.    [provider]  TRAMADOL HCL ER PO Take by mouth.    [provider]    Physical Exam: Vitals:   May 29, 2018 0647 May 29, 2018 0700 05/29/18 0744 May 29, 2018 0753  BP: (!) 91/52 (!) 67/27    Pulse:   (!) 0   Resp: (!) 32 (!) 27 (!) 0 (!) 0  SpO2:  (!) 68%    Weight:      Height:         General: Patient was gray with minimal respiratory effort at the time of my evaluation and expired within minutes of my arrival in the room Eyes: open, staring, unblinking Cardiovascular: Asystole developed shortly after my arrival in the room and was confirmed on exam Respiratory: Agonal and then absent Skin: gray    Radiological Exams on Admission: Ct Head Wo Contrast  Result Date: May 29, 2018 CLINICAL DATA:  Altered mental status EXAM: CT HEAD WITHOUT CONTRAST CT CERVICAL SPINE  WITHOUT CONTRAST TECHNIQUE: Multidetector CT imaging of the head and cervical spine was performed following the standard protocol without intravenous contrast. Multiplanar CT image reconstructions of the cervical spine were also generated. COMPARISON:  None. FINDINGS: CT HEAD FINDINGS Brain: No mass or acute hemorrhage. There is prominence of the extra-axial spaces over both convexities. Chronic Areas of hypoattenuation of the deep gray nuclei and confluent periventricular white matter hypodensity, consistent with chronic small vessel disease. Vascular: Atherosclerotic calcification of the vertebral and internal carotid arteries at the skull base. No abnormal hyperdensity of the major intracranial arteries or dural venous sinuses. Skull: The visualized skull base, calvarium and extracranial soft tissues are normal. Sinuses/Orbits: No fluid levels or advanced mucosal thickening of the visualized paranasal sinuses. No mastoid or middle ear effusion. The orbits are normal. CT CERVICAL SPINE FINDINGS Alignment: Trace grade 1 anterolisthesis at C3-C4 due to facet hypertrophy. Facets are aligned. Occipital condyles are normally positioned. Skull base and vertebrae: No acute fracture. Soft tissues and spinal canal: No prevertebral fluid or swelling. No visible canal hematoma. Disc levels: No advanced spinal canal or neural foraminal stenosis. Upper chest: No pneumothorax, pulmonary nodule or pleural effusion. Other: Normal visualized paraspinal cervical soft tissues. IMPRESSION: 1. Biconvexity extra-axial space prominence, which may indicate chronic subdural hematomas or subdural hygromas. No associated mass effect. No acute hemorrhage. 2. Chronic ischemic microangiopathy. 3. No acute fracture of the cervical spine. Electronically Signed   By: Ulyses Jarred M.D.   On: 2018/05/29 05:51   Ct Cervical Spine Wo Contrast  Result Date: 05-29-2018 CLINICAL DATA:  Altered mental status EXAM: CT HEAD WITHOUT CONTRAST CT  CERVICAL SPINE WITHOUT CONTRAST TECHNIQUE: Multidetector CT imaging of the head and cervical spine was performed following the standard protocol without intravenous contrast. Multiplanar CT image reconstructions of the cervical spine were also generated. COMPARISON:  None. FINDINGS: CT HEAD FINDINGS Brain: No mass or acute hemorrhage. There is prominence of the extra-axial spaces over both convexities. Chronic Areas of hypoattenuation of the deep gray nuclei and confluent periventricular white matter hypodensity, consistent with chronic small vessel disease. Vascular: Atherosclerotic calcification of the vertebral and internal carotid arteries at the skull base. No abnormal hyperdensity of the major intracranial arteries or dural venous sinuses. Skull: The visualized skull base, calvarium and extracranial soft tissues are normal. Sinuses/Orbits: No fluid levels or advanced mucosal thickening of the visualized paranasal sinuses. No mastoid or middle ear effusion. The orbits are normal. CT CERVICAL  SPINE FINDINGS Alignment: Trace grade 1 anterolisthesis at C3-C4 due to facet hypertrophy. Facets are aligned. Occipital condyles are normally positioned. Skull base and vertebrae: No acute fracture. Soft tissues and spinal canal: No prevertebral fluid or swelling. No visible canal hematoma. Disc levels: No advanced spinal canal or neural foraminal stenosis. Upper chest: No pneumothorax, pulmonary nodule or pleural effusion. Other: Normal visualized paraspinal cervical soft tissues. IMPRESSION: 1. Biconvexity extra-axial space prominence, which may indicate chronic subdural hematomas or subdural hygromas. No associated mass effect. No acute hemorrhage. 2. Chronic ischemic microangiopathy. 3. No acute fracture of the cervical spine. Electronically Signed   By: Ulyses Jarred M.D.   On: June 05, 2018 05:51   Dg Chest Port 1 View  Result Date: June 05, 2018 CLINICAL DATA:  Sepsis EXAM: PORTABLE CHEST 1 VIEW COMPARISON:  None.  FINDINGS: The heart size and mediastinal contours are within normal limits. Both lungs are clear. The visualized skeletal structures are unremarkable. IMPRESSION: No active disease. Electronically Signed   By: Ulyses Jarred M.D.   On: 06/05/18 03:37    EKG: Independently reviewed.  NSR with rate 82; nonspecific ST changes with no evidence of acute ischemia   Labs on Admission: I have personally reviewed the available labs and imaging studies at the time of the admission.  Pertinent labs:   Glucose 205 Creatinine 1.20, up from 0.71 WBC 21.2 Hgb 6.6 Lactate 7.03, 8.34  Assessment/Plan Principal Problem:   Hypovolemic shock (HCC) Active Problems:   History of head and neck cancer   Anemia   Lactic acidosis   -Patient with remote h/o head and neck cancer presenting with subacute progressive weakness, night sweats, failure to thrive -He was profoundly ill on arrival with hypovolemic shock, found to have severe anemia of uncertain etiology and severe lactic acidosis resulting from hypoperfusion -He progressed quickly while in the ER -His condition was grave upon arrival and worsened -His wife confirmed his desire for a natural death and he was transitioned to comfort measures -He was bradycardic and severely hypotensive prior to my arrival in the room -He expired shortly after I began speaking with his wife -His wife was appropriate and understood the severity of his condition and that he died peacefully     Karmen Bongo MD [facility]  If note is complete, please contact covering daytime or nighttime physician. [email or web address] Password [email or web address]  Jun 05, 2018, 8:09 AM

## 2018-05-25 NOTE — ED Notes (Signed)
Pt has a brujise over his lt eye with a cut through his eyebrow

## 2018-05-25 NOTE — ED Notes (Signed)
Pt not talking he keeps yawning   His wife just called and she plans to arrive here around 0500am today

## 2018-05-25 NOTE — Progress Notes (Signed)
   02-Jun-2018 0700  Clinical Encounter Type  Visited With Patient and family together  Visit Type Death  Referral From Nurse   Chaplain responded to request to be at bedside with the patient and his wife, Zigmund Daniel. At [place] request, chaplain offered a prayer. By the time a new doctor came in, [place] had died. Zigmund Daniel was amazingly composed, given the circumstances.Her brother is coming to pick her up. [facility] will handle the arrangements.

## 2018-05-25 NOTE — ED Notes (Signed)
Dr Betsey Holiday informed of lactic acid results 7.03

## 2018-05-25 NOTE — ED Provider Notes (Signed)
[facility] EMERGENCY DEPARTMENT Provider Note   CSN: [id number] Arrival date & time: 05-27-2018  0102     History   Chief Complaint Chief Complaint  Patient presents with  . Altered Mental Status    HPI Kevin Bender is a 84 y.o. male.  Patient brought to the emergency department from home by EMS.  Family called out because of generalized weakness.  EMS report that they found the patient on the floor covered in feces.  He apparently had an unwitnessed fall.  EMS reports that he is very hypotensive upon their arrival to the home.  He was, however, alert and able to follow commands.  Patient was administered IV fluids during transport, blood pressure improved but is now dropping again.  Patient also has become less alert and no longer following commands.  He is a DNR.     Past Medical History:  Diagnosis Date  . Dry mouth   . Head and neck cancer ([place]) 2005  . Urinary frequency     Patient Active Problem List   Diagnosis Date Noted  . CARCINOMA, SQUAMOUS CELL 05/03/2010  . PAIN IN SOFT TISSUES OF LIMB 05/03/2010  . CLOSED DISLOCATION OF OTHER PART OF FOOT 05/03/2010  . KNEE REPLACEMENT, LEFT, HX OF 05/03/2010    Past Surgical History:  Procedure Laterality Date  . ESOPHAGEAL DILATION    . KNEE SURGERY          Home Medications    Prior to Admission medications   Medication Sig Start Date End Date Taking? Authorizing Provider  Fesoterodine Fumarate (TOVIAZ PO) Take by mouth.    [provider]  HYDRALAZINE-HCTZ PO Take by mouth.    [provider]  TRAMADOL HCL ER PO Take by mouth.    [provider]    Family History No family history on file.  Social History Social History   Tobacco Use  . Smoking status: Never Smoker  . Smokeless tobacco: Never Used  Substance Use Topics  . Alcohol use: No  . Drug use: No     Allergies   Ibuprofen   Review of Systems Review of Systems  Unable to perform ROS:  Acuity of condition     Physical Exam Updated Vital Signs BP (!) 91/52   Pulse (!) 102   Resp (!) 32   Ht 5\' 11"  (1.803 m)   Wt 63.5 kg   SpO2 100%   BMI 19.52 kg/m   Physical Exam  Constitutional: He appears well-developed and well-nourished.  HENT:  Head: Head is with contusion (Left eyebrow) and with laceration (Left eyebrow).  Eyes: Pupils are equal, round, and reactive to light.  Cardiovascular: Normal rate, regular rhythm and normal heart sounds.  Pulmonary/Chest: Effort normal and breath sounds normal.  Abdominal: Soft.  Musculoskeletal: Normal range of motion. He exhibits no edema or deformity.  Neurological: No cranial nerve deficit or sensory deficit. GCS eye subscore is 4. GCS verbal subscore is 4. GCS motor subscore is 5.  Skin: Laceration (Left eyebrow) noted.  Mottled     ED Treatments / Results  Labs (all labs ordered are listed, but only abnormal results are displayed) Labs Reviewed  COMPREHENSIVE METABOLIC PANEL - Abnormal; Notable for the following components:      Result Value   Glucose, Bld 205 (*)    Calcium 7.6 (*)    Total Protein 5.1 (*)    Albumin 2.2 (*)    GFR calc non Af Amer 54 (*)  All other components within normal limits  CBC WITH DIFFERENTIAL/PLATELET - Abnormal; Notable for the following components:   WBC 21.2 (*)    RBC 2.23 (*)    Hemoglobin 6.6 (*)    HCT 21.9 (*)    All other components within normal limits  I-STAT CG4 LACTIC ACID, ED - Abnormal; Notable for the following components:   Lactic Acid, Venous 7.03 (*)    All other components within normal limits  I-STAT CG4 LACTIC ACID, ED - Abnormal; Notable for the following components:   Lactic Acid, Venous 8.34 (*)    All other components within normal limits  CULTURE, BLOOD (ROUTINE X 2)  CULTURE, BLOOD (ROUTINE X 2)  URINALYSIS, ROUTINE W REFLEX MICROSCOPIC  POC OCCULT BLOOD, ED  TYPE AND SCREEN  PREPARE RBC (CROSSMATCH)  ABO/RH    EKG EKG  Interpretation  Date/Time:  06-07-2018 03:19:48 EST Ventricular Rate:  82 PR Interval:    QRS Duration: 85 QT Interval:  410 QTC Calculation: 479 R Axis:   80 Text Interpretation:  Sinus rhythm Probable left atrial enlargement Borderline low voltage, extremity leads Nonspecific T abnrm, anterolateral leads Borderline prolonged QT interval No previous tracing Confirmed by Orpah Greek [phone]) on 06-07-2018 3:21:39 AM Also confirmed by Orpah Greek [phone]), editor Hattie Perch ([id number])  on 06/07/2018 6:57:57 AM   Radiology Ct Head Wo Contrast  Result Date: 07-Jun-2018 CLINICAL DATA:  Altered mental status EXAM: CT HEAD WITHOUT CONTRAST CT CERVICAL SPINE WITHOUT CONTRAST TECHNIQUE: Multidetector CT imaging of the head and cervical spine was performed following the standard protocol without intravenous contrast. Multiplanar CT image reconstructions of the cervical spine were also generated. COMPARISON:  None. FINDINGS: CT HEAD FINDINGS Brain: No mass or acute hemorrhage. There is prominence of the extra-axial spaces over both convexities. Chronic Areas of hypoattenuation of the deep gray nuclei and confluent periventricular white matter hypodensity, consistent with chronic small vessel disease. Vascular: Atherosclerotic calcification of the vertebral and internal carotid arteries at the skull base. No abnormal hyperdensity of the major intracranial arteries or dural venous sinuses. Skull: The visualized skull base, calvarium and extracranial soft tissues are normal. Sinuses/Orbits: No fluid levels or advanced mucosal thickening of the visualized paranasal sinuses. No mastoid or middle ear effusion. The orbits are normal. CT CERVICAL SPINE FINDINGS Alignment: Trace grade 1 anterolisthesis at C3-C4 due to facet hypertrophy. Facets are aligned. Occipital condyles are normally positioned. Skull base and vertebrae: No acute fracture. Soft tissues and spinal canal: No  prevertebral fluid or swelling. No visible canal hematoma. Disc levels: No advanced spinal canal or neural foraminal stenosis. Upper chest: No pneumothorax, pulmonary nodule or pleural effusion. Other: Normal visualized paraspinal cervical soft tissues. IMPRESSION: 1. Biconvexity extra-axial space prominence, which may indicate chronic subdural hematomas or subdural hygromas. No associated mass effect. No acute hemorrhage. 2. Chronic ischemic microangiopathy. 3. No acute fracture of the cervical spine. Electronically Signed   By: Ulyses Jarred M.D.   On: Jun 07, 2018 05:51   Ct Cervical Spine Wo Contrast  Result Date: 07-Jun-2018 CLINICAL DATA:  Altered mental status EXAM: CT HEAD WITHOUT CONTRAST CT CERVICAL SPINE WITHOUT CONTRAST TECHNIQUE: Multidetector CT imaging of the head and cervical spine was performed following the standard protocol without intravenous contrast. Multiplanar CT image reconstructions of the cervical spine were also generated. COMPARISON:  None. FINDINGS: CT HEAD FINDINGS Brain: No mass or acute hemorrhage. There is prominence of the extra-axial spaces over both convexities. Chronic Areas of hypoattenuation of the deep  gray nuclei and confluent periventricular white matter hypodensity, consistent with chronic small vessel disease. Vascular: Atherosclerotic calcification of the vertebral and internal carotid arteries at the skull base. No abnormal hyperdensity of the major intracranial arteries or dural venous sinuses. Skull: The visualized skull base, calvarium and extracranial soft tissues are normal. Sinuses/Orbits: No fluid levels or advanced mucosal thickening of the visualized paranasal sinuses. No mastoid or middle ear effusion. The orbits are normal. CT CERVICAL SPINE FINDINGS Alignment: Trace grade 1 anterolisthesis at C3-C4 due to facet hypertrophy. Facets are aligned. Occipital condyles are normally positioned. Skull base and vertebrae: No acute fracture. Soft tissues and spinal  canal: No prevertebral fluid or swelling. No visible canal hematoma. Disc levels: No advanced spinal canal or neural foraminal stenosis. Upper chest: No pneumothorax, pulmonary nodule or pleural effusion. Other: Normal visualized paraspinal cervical soft tissues. IMPRESSION: 1. Biconvexity extra-axial space prominence, which may indicate chronic subdural hematomas or subdural hygromas. No associated mass effect. No acute hemorrhage. 2. Chronic ischemic microangiopathy. 3. No acute fracture of the cervical spine. Electronically Signed   By: Ulyses Jarred M.D.   On: 2018/05/29 05:51   Dg Chest Port 1 View  Result Date: 05/29/2018 CLINICAL DATA:  Sepsis EXAM: PORTABLE CHEST 1 VIEW COMPARISON:  None. FINDINGS: The heart size and mediastinal contours are within normal limits. Both lungs are clear. The visualized skeletal structures are unremarkable. IMPRESSION: No active disease. Electronically Signed   By: Ulyses Jarred M.D.   On: May 29, 2018 03:37    Procedures Procedures (including critical care time)  Medications Ordered in ED Medications  metroNIDAZOLE (FLAGYL) IVPB 500 mg (0 mg Intravenous Stopped 05/29/18 0451)  ceFEPIme (MAXIPIME) 1 g in sodium chloride 0.9 % 100 mL IVPB (has no administration in time range)  vancomycin (VANCOCIN) IVPB 1000 mg/200 mL premix (has no administration in time range)  0.9 %  sodium chloride infusion (has no administration in time range)  iopamidol (ISOVUE-370) 76 % injection (has no administration in time range)  sodium chloride 0.9 % bolus 1,000 mL (0 mLs Intravenous Stopped 05-29-2018 0351)    And  sodium chloride 0.9 % bolus 1,000 mL (0 mLs Intravenous Stopped May 29, 2018 0407)  ceFEPIme (MAXIPIME) 2 g in sodium chloride 0.9 % 100 mL IVPB (0 g Intravenous Stopped May 29, 2018 0402)  vancomycin (VANCOCIN) IVPB 1000 mg/200 mL premix (0 mg Intravenous Stopped 05-29-2018 0528)     Initial Impression / Assessment and Plan / ED Course  I have reviewed the triage vital signs  and the nursing notes.  Pertinent labs & imaging results that were available during my care of the patient were reviewed by me and considered in my medical decision making (see chart for details).     Patient presents to the emergency department from home.  Patient reportedly had an unwitnessed fall and was unable to get up.  Patient's wife contacted EMS.  EMS report that the patient was alert and able to answer questions, follow commands for them upon their arrival to the home.  He was, however, hypotensive.  Patient administered IV fluids with initial response of his blood pressure.  During transport, however, blood pressure began to drop again and he was noted to become more confused and less responsive.  Patient is a DNR.  It appears that he gets most of his care at [facility].  Records from there indicate a past history of arthritis and a distant history of laryngeal cancer.  Patient persistently hypotensive here in the ER.  He had presumed sepsis upon arrival, was administered broad-spectrum antibiotics and aggressive fluid resuscitation.  Blood pressure remained low.  Blood work did show anemia of unclear etiology.  Rectal exam was heme-negative.  Multiple attempts to reach the wife between 5 AM and 7 AM were unsuccessful.  She did arrive in the ER at approximately 7 AM and I did have a lengthy conversation with her.  I informed her that he was critically ill and not expected to survive.  At this point she reported that she did not want any further aggressive intervention and would prefer he simply be kept comfortable.  Discussed with Dr. Alcario Drought, will have AM team evaluate patient for admission.  CRITICAL CARE Performed by: Orpah Greek   Total critical care time: 45 minutes  Critical care time was exclusive of separately billable procedures and treating other patients.  Critical care was necessary to treat or prevent imminent or life-threatening  deterioration.  Critical care was time spent personally by me on the following activities: development of treatment plan with patient and/or surrogate as well as nursing, discussions with consultants, evaluation of patient's response to treatment, examination of patient, obtaining history from patient or surrogate, ordering and performing treatments and interventions, ordering and review of laboratory studies, ordering and review of radiographic studies, pulse oximetry and re-evaluation of patient's condition.   Final Clinical Impressions(s) / ED Diagnoses   Final diagnoses:  Cardiopulmonary arrest [facility])    ED Discharge Orders    None       Orpah Greek, MD 05-29-2018 [phone]

## 2018-05-25 NOTE — ED Notes (Signed)
Pt taken to morgue. 

## 2018-05-25 DEATH — deceased

## 2020-06-04 IMAGING — DX DG CHEST 1V PORT
1 series · 1 of 1 positions shown · non-contrast
Comparison: None.

CLINICAL DATA: Sepsis

EXAM:
PORTABLE CHEST 1 VIEW

[chest ap]
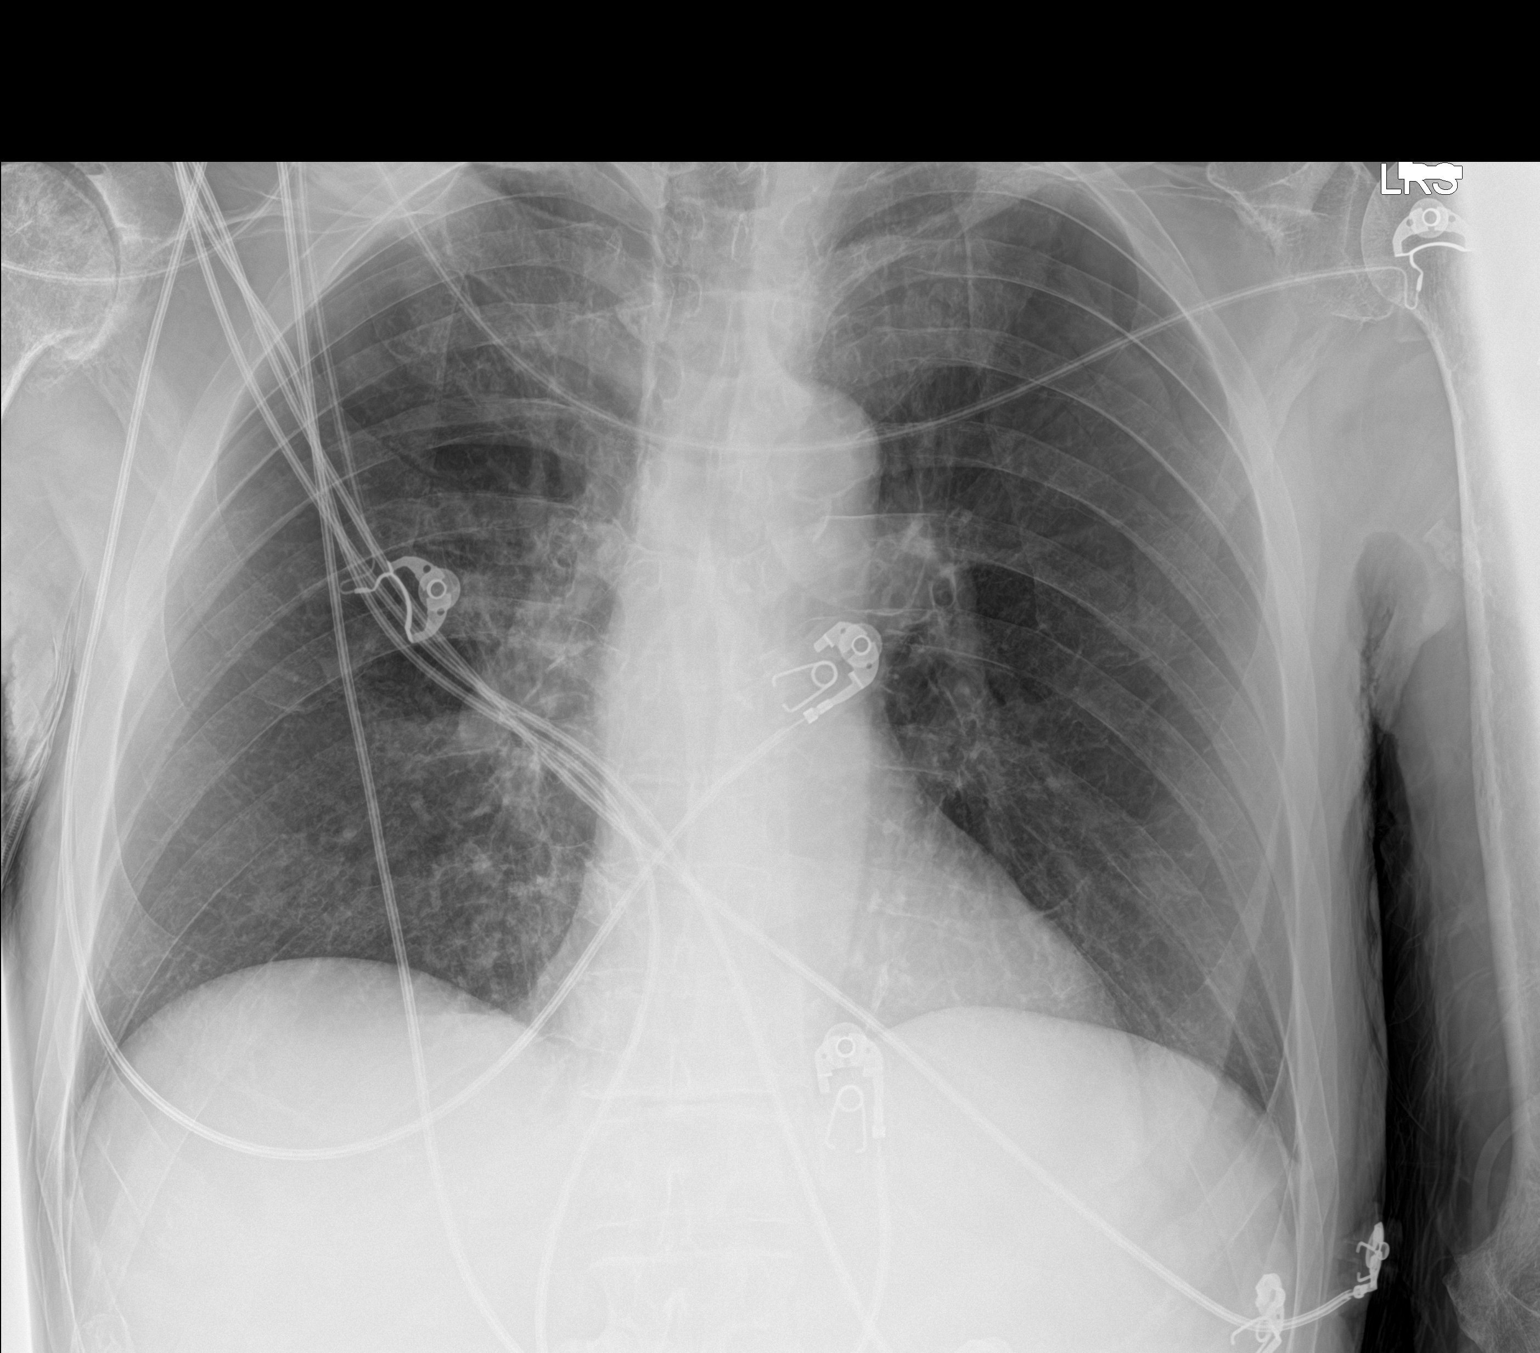

[1 of 1 positions shown; findings below may reference images not displayed]

FINDINGS: The heart size and mediastinal contours are within normal limits.
Both lungs are clear. The visualized skeletal structures are
unremarkable.
IMPRESSION: No active disease.

## 2020-06-04 IMAGING — CT CT CERVICAL SPINE W/O CM
4 of 7 series · 14 of 33 positions shown, 15 images · non-contrast
Comparison: None.

CLINICAL DATA: Altered mental status

EXAM:
CT HEAD WITHOUT CONTRAST
CT CERVICAL SPINE WITHOUT CONTRAST
TECHNIQUE: Multidetector CT imaging of the head and cervical spine was
performed following the standard protocol without intravenous
contrast. Multiplanar CT image reconstructions of the cervical spine
were also generated.

[Series 4: head bone · axial · 0.46mm/px · z∈[-148,-44]mm · 4 of 88 slices shown]
[im 18/88  bone]
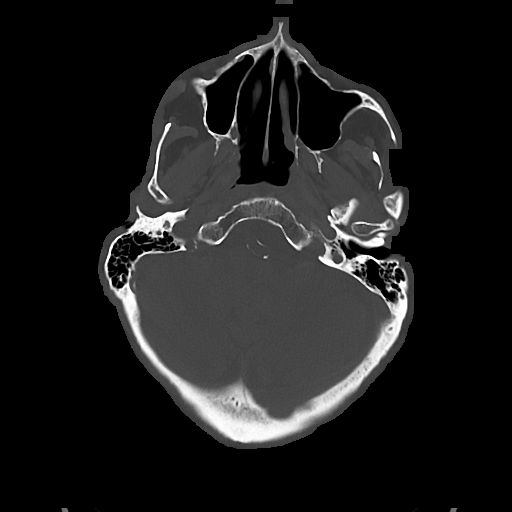
[im 35/88  bone]
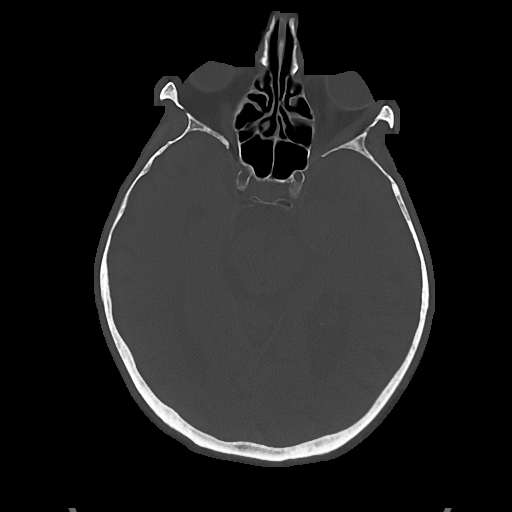
[im 53/88  bone]
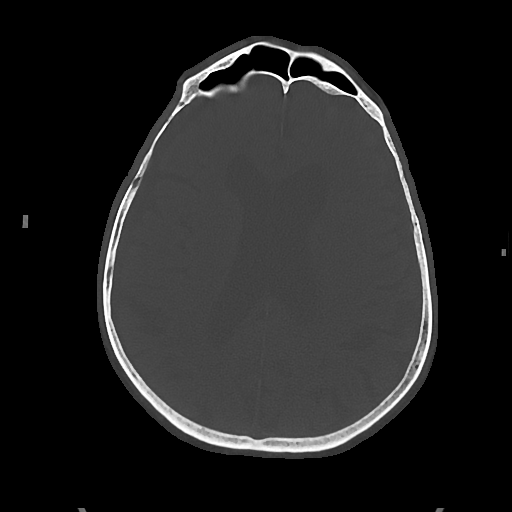
[im 70/88  bone]
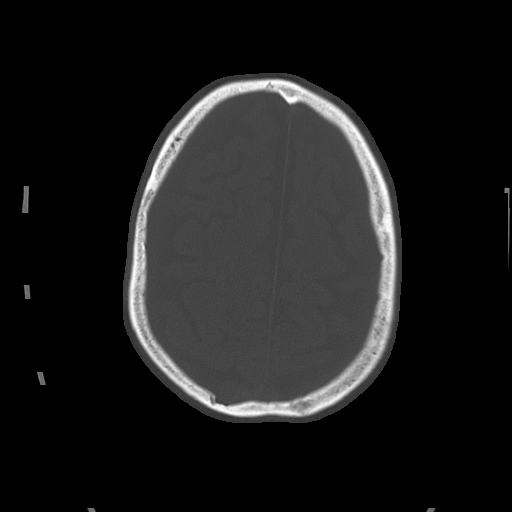

[Series 10: sag bone · sagittal · 0.30mm/px · 5 of 68 slices shown]
[im 12/68  bone]
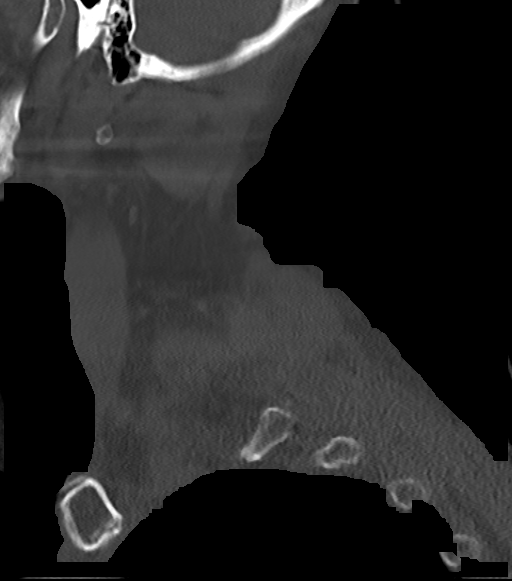
[im 23/68  bone]
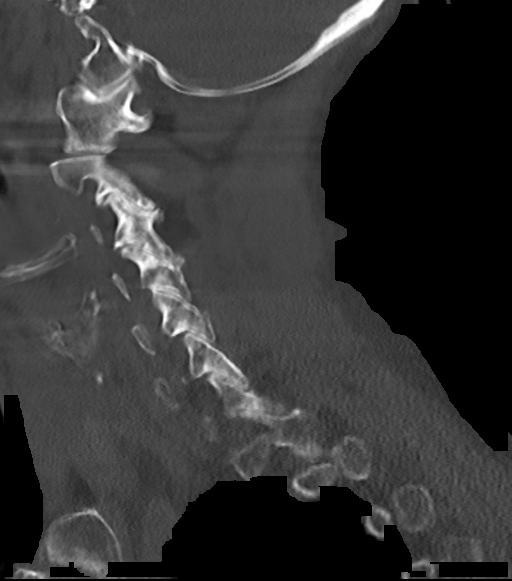
[im 34/68  bone]
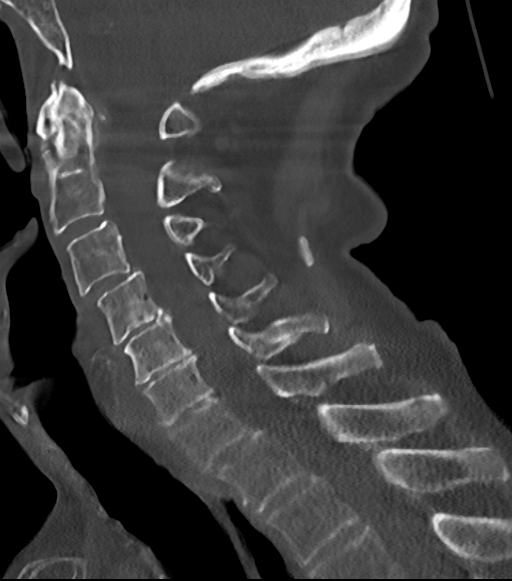
[im 45/68  bone]
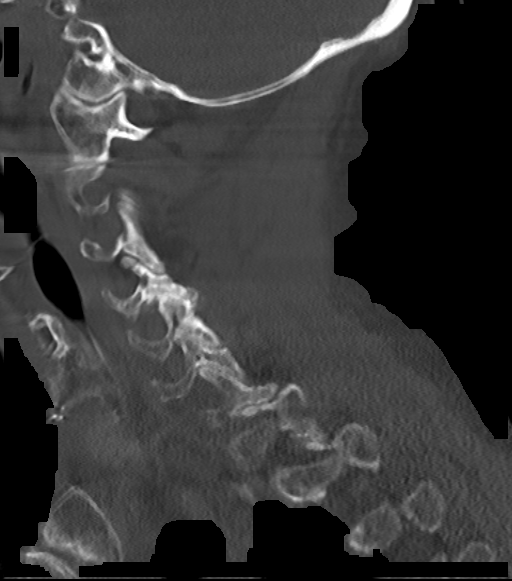
[im 56/68  bone]
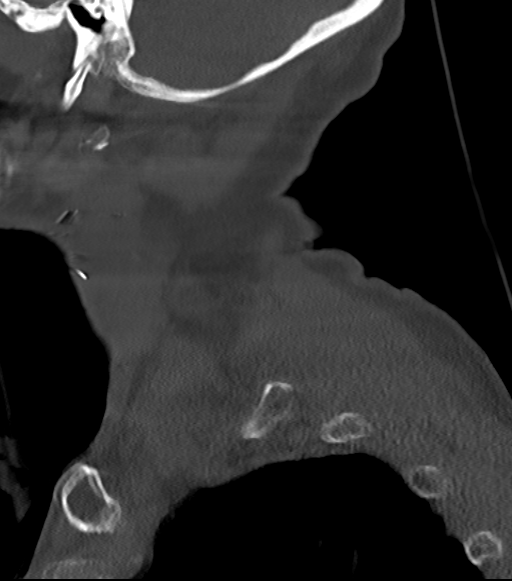

[Series 11: cor bone · coronal · 0.27mm/px · 1 of 56 slices shown]
[im 28/56  bone]
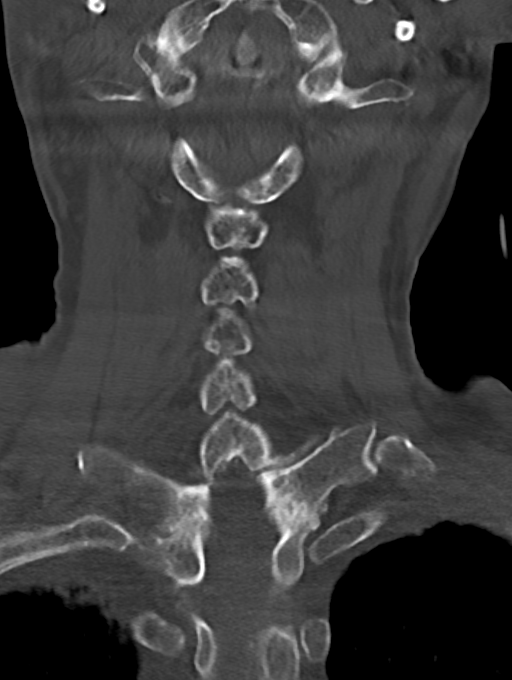

[Series 14: c spine soft · axial · 0.41mm/px · z∈[-276,-168]mm · 4 of 92 slices shown, 5 images]
[im 19/92  soft-tissue]
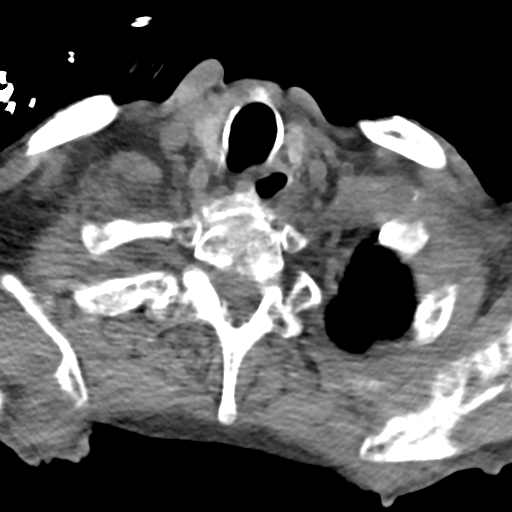
[im 19/92  bone]
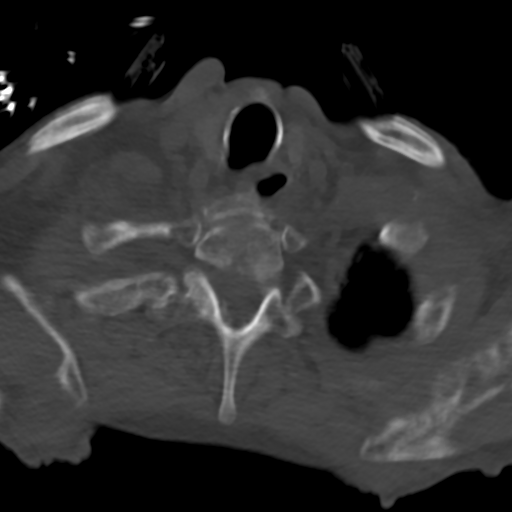
[im 37/92  bone]
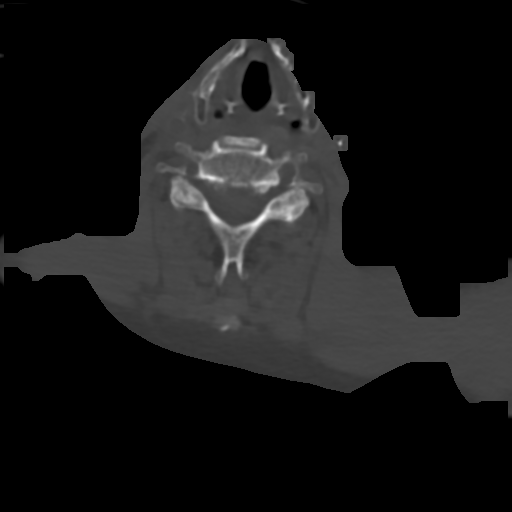
[im 55/92  bone]
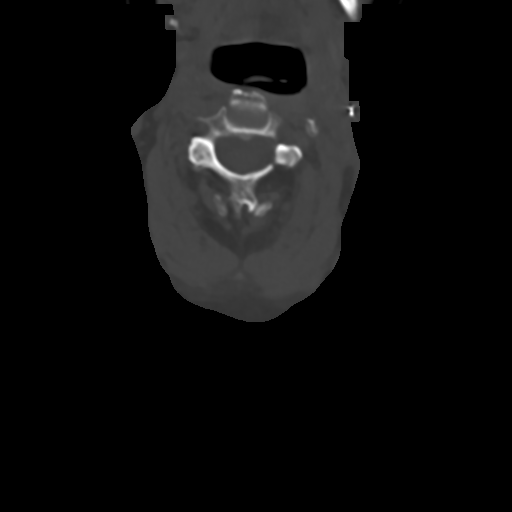
[im 73/92  bone]
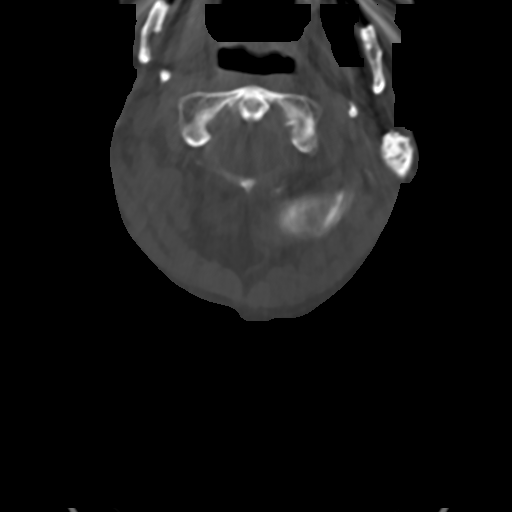

[14 of 33 positions shown; findings below may reference images not displayed]

FINDINGS: CT HEAD FINDINGS

Brain: No mass or acute hemorrhage. There is prominence of the
extra-axial spaces over both convexities. Chronic Areas of
hypoattenuation of the deep gray nuclei and confluent
periventricular white matter hypodensity, consistent with chronic
small vessel disease.

Vascular: Atherosclerotic calcification of the vertebral and
internal carotid arteries at the skull base. No abnormal
hyperdensity of the major intracranial arteries or dural venous
sinuses.

Skull: The visualized skull base, calvarium and extracranial soft
tissues are normal.

Sinuses/Orbits: No fluid levels or advanced mucosal thickening of
the visualized paranasal sinuses. No mastoid or middle ear effusion.
The orbits are normal.

CT CERVICAL SPINE FINDINGS

Alignment: Trace grade 1 anterolisthesis at C3-C4 due to facet
hypertrophy. Facets are aligned. Occipital condyles are normally
positioned.

Skull base and vertebrae: No acute fracture.

Soft tissues and spinal canal: No prevertebral fluid or swelling. No
visible canal hematoma.

Disc levels: No advanced spinal canal or neural foraminal stenosis.

Upper chest: No pneumothorax, pulmonary nodule or pleural effusion.

Other: Normal visualized paraspinal cervical soft tissues.
IMPRESSION: 1. Biconvexity extra-axial space prominence, which may indicate
chronic subdural hematomas or subdural hygromas. No associated mass
effect. No acute hemorrhage.
2. Chronic ischemic microangiopathy.
3. No acute fracture of the cervical spine.
